# Patient Record
Sex: Female | Born: 1990 | Race: Black or African American | Hispanic: No | Marital: Single | Smoking: Current every day smoker
Health system: Southern US, Community
[De-identification: ages and names within clinical notes are randomized; demographics above are authoritative.]

## PROBLEM LIST (undated history)

## (undated) DIAGNOSIS — O1490 Unspecified pre-eclampsia, unspecified trimester: Secondary | ICD-10-CM

## (undated) DIAGNOSIS — R569 Unspecified convulsions: Secondary | ICD-10-CM

---

## 2019-01-23 ENCOUNTER — Inpatient Hospital Stay: Payer: Medicaid Other | Admitting: Anesthesiology

## 2019-01-23 ENCOUNTER — Inpatient Hospital Stay
Admission: EM | Admit: 2019-01-23 | Discharge: 2019-01-25 | DRG: 787 | Disposition: A | Discharge status: Home / Self Care | Payer: Medicaid Other | Attending: Obstetrics and Gynecology | Admitting: Obstetrics and Gynecology

## 2019-01-23 ENCOUNTER — Other Ambulatory Visit: Payer: Self-pay

## 2019-01-23 ENCOUNTER — Encounter
Admission: EM | Disposition: A | Discharge status: Home / Self Care | Payer: Self-pay | Source: Home / Self Care | Attending: Obstetrics and Gynecology

## 2019-01-23 DIAGNOSIS — Z3A38 38 weeks gestation of pregnancy: Secondary | ICD-10-CM | POA: Diagnosis not present

## 2019-01-23 DIAGNOSIS — O99324 Drug use complicating childbirth: Secondary | ICD-10-CM | POA: Diagnosis present

## 2019-01-23 DIAGNOSIS — D62 Acute posthemorrhagic anemia: Secondary | ICD-10-CM | POA: Diagnosis not present

## 2019-01-23 DIAGNOSIS — Z1159 Encounter for screening for other viral diseases: Secondary | ICD-10-CM

## 2019-01-23 DIAGNOSIS — O9081 Anemia of the puerperium: Secondary | ICD-10-CM | POA: Diagnosis not present

## 2019-01-23 DIAGNOSIS — R633 Feeding difficulties: Secondary | ICD-10-CM

## 2019-01-23 DIAGNOSIS — F1290 Cannabis use, unspecified, uncomplicated: Secondary | ICD-10-CM | POA: Diagnosis present

## 2019-01-23 DIAGNOSIS — R6339 Other feeding difficulties: Secondary | ICD-10-CM

## 2019-01-23 DIAGNOSIS — O34211 Maternal care for low transverse scar from previous cesarean delivery: Secondary | ICD-10-CM | POA: Diagnosis present

## 2019-01-23 LAB — CBC
HCT: 35.4 % — ABNORMAL LOW (ref 36.0–46.0)
Hemoglobin: 10.7 g/dL — ABNORMAL LOW (ref 12.0–15.0)
MCH: 23.8 pg — ABNORMAL LOW (ref 26.0–34.0)
MCHC: 30.2 g/dL (ref 30.0–36.0)
MCV: 78.7 fL — ABNORMAL LOW (ref 80.0–100.0)
Platelets: 236 10*3/uL (ref 150–400)
RBC: 4.5 MIL/uL (ref 3.87–5.11)
RDW: 22.5 % — ABNORMAL HIGH (ref 11.5–15.5)
WBC: 14.6 10*3/uL — ABNORMAL HIGH (ref 4.0–10.5)
nRBC: 0 % (ref 0.0–0.2)

## 2019-01-23 LAB — WET PREP, GENITAL
Clue Cells Wet Prep HPF POC: NONE SEEN
Sperm: NONE SEEN
Trich, Wet Prep: NONE SEEN
Yeast Wet Prep HPF POC: NONE SEEN

## 2019-01-23 LAB — BASIC METABOLIC PANEL
Anion gap: 9 (ref 5–15)
BUN: 7 mg/dL (ref 6–20)
CO2: 23 mmol/L (ref 22–32)
Calcium: 8.7 mg/dL — ABNORMAL LOW (ref 8.9–10.3)
Chloride: 104 mmol/L (ref 98–111)
Creatinine, Ser: 0.57 mg/dL (ref 0.44–1.00)
GFR calc Af Amer: >60 mL/min (ref >60)
GFR calc non Af Amer: >60 mL/min (ref >60)
Glucose, Bld: 79 mg/dL (ref 70–99)
Potassium: 3.6 mmol/L (ref 3.5–5.1)
Sodium: 136 mmol/L (ref 135–145)

## 2019-01-23 LAB — RAPID HIV SCREEN (HIV 1/2 AB+AG)
HIV 1/2 Antibodies: NONREACTIVE
HIV-1 P24 Antigen - HIV24: NONREACTIVE

## 2019-01-23 LAB — GLUCOSE, CAPILLARY
Glucose-Capillary: 58 mg/dL — ABNORMAL LOW (ref 70–99)
Glucose-Capillary: 60 mg/dL — ABNORMAL LOW (ref 70–99)
Glucose-Capillary: 124 mg/dL — ABNORMAL HIGH (ref 70–99)

## 2019-01-23 SURGERY — Surgical Case
Anesthesia: Spinal

## 2019-01-23 MED ORDER — FENTANYL CITRATE (PF) 100 MCG/2ML IJ SOLN
INTRAMUSCULAR | Status: DC | PRN
  Administered 2019-01-23: 15 ug via INTRATHECAL

## 2019-01-23 MED ORDER — CEFAZOLIN SODIUM-DEXTROSE 2-4 GM/100ML-% IV SOLN
2.0000 g | INTRAVENOUS | Status: AC
  Administered 2019-01-23: 2 g via INTRAVENOUS
  Filled 2019-01-23: qty 100

## 2019-01-23 MED ORDER — DIPHENHYDRAMINE HCL 25 MG PO CAPS: 25.0000 mg | ORAL_CAPSULE | ORAL | Status: DC | PRN

## 2019-01-23 MED ORDER — OXYCODONE HCL 5 MG PO TABS
5.0000 mg | ORAL_TABLET | ORAL | Status: DC | PRN
  Administered 2019-01-23 – 2019-01-25 (×7): 10 mg via ORAL
  Filled 2019-01-23 (×7): qty 2

## 2019-01-23 MED ORDER — BUPIVACAINE IN DEXTROSE 0.75-8.25 % IT SOLN
INTRATHECAL | Status: DC | PRN
  Administered 2019-01-23: 1.6 mL via INTRATHECAL

## 2019-01-23 MED ORDER — NALBUPHINE HCL 10 MG/ML IJ SOLN: 5.0000 mg | Freq: Once | INTRAMUSCULAR | Status: DC | PRN

## 2019-01-23 MED ORDER — SODIUM CHLORIDE 0.9 % IV SOLN
500.0000 mg | INTRAVENOUS | Status: DC
  Administered 2019-01-23: 500 mg via INTRAVENOUS
  Filled 2019-01-23: qty 500

## 2019-01-23 MED ORDER — NALBUPHINE HCL 10 MG/ML IJ SOLN: 5.0000 mg | INTRAMUSCULAR | Status: DC | PRN

## 2019-01-23 MED ORDER — FLEET ENEMA 7-19 GM/118ML RE ENEM: 1.0000 | ENEMA | Freq: Every day | RECTAL | Status: DC | PRN

## 2019-01-23 MED ORDER — MEASLES, MUMPS & RUBELLA VAC IJ SOLR
0.5000 mL | Freq: Once | INTRAMUSCULAR | Status: DC
  Filled 2019-01-23 (×2): qty 0.5

## 2019-01-23 MED ORDER — MORPHINE SULFATE (PF) 0.5 MG/ML IJ SOLN
INTRAMUSCULAR | Status: DC | PRN
  Administered 2019-01-23: .1 mg via INTRATHECAL

## 2019-01-23 MED ORDER — SOD CITRATE-CITRIC ACID 500-334 MG/5ML PO SOLN
30.0000 mL | ORAL | Status: AC
  Administered 2019-01-23: 09:00:00 30 mL via ORAL

## 2019-01-23 MED ORDER — BUPIVACAINE LIPOSOME 1.3 % IJ SUSP
INTRAMUSCULAR | Status: AC
  Filled 2019-01-23: qty 20

## 2019-01-23 MED ORDER — NALOXONE HCL 0.4 MG/ML IJ SOLN: 0.4000 mg | INTRAMUSCULAR | Status: DC | PRN

## 2019-01-23 MED ORDER — SODIUM CHLORIDE (PF) 0.9 % IJ SOLN
INTRAMUSCULAR | Status: AC
  Filled 2019-01-23: qty 50

## 2019-01-23 MED ORDER — SIMETHICONE 80 MG PO CHEW
80.0000 mg | CHEWABLE_TABLET | ORAL | Status: DC | PRN
  Filled 2019-01-23: qty 1

## 2019-01-23 MED ORDER — PRENATAL MULTIVITAMIN CH
1.0000 | ORAL_TABLET | Freq: Every day | ORAL | Status: DC
  Administered 2019-01-23 – 2019-01-25 (×3): 1 via ORAL
  Filled 2019-01-23 (×3): qty 1

## 2019-01-23 MED ORDER — KETOROLAC TROMETHAMINE 30 MG/ML IJ SOLN
30.0000 mg | Freq: Four times a day (QID) | INTRAMUSCULAR | Status: AC
  Administered 2019-01-23: 30 mg via INTRAVENOUS
  Filled 2019-01-23 (×2): qty 1

## 2019-01-23 MED ORDER — SENNOSIDES-DOCUSATE SODIUM 8.6-50 MG PO TABS
2.0000 | ORAL_TABLET | ORAL | Status: DC
  Administered 2019-01-23 – 2019-01-24 (×2): 2 via ORAL
  Filled 2019-01-23 (×2): qty 2

## 2019-01-23 MED ORDER — DEXTROSE IN LACTATED RINGERS 5 % IV SOLN
INTRAVENOUS | Status: DC
  Administered 2019-01-23: 12:00:00 via INTRAVENOUS

## 2019-01-23 MED ORDER — FENTANYL CITRATE (PF) 100 MCG/2ML IJ SOLN
INTRAMUSCULAR | Status: AC
  Filled 2019-01-23: qty 2

## 2019-01-23 MED ORDER — IBUPROFEN 800 MG PO TABS
800.0000 mg | ORAL_TABLET | Freq: Four times a day (QID) | ORAL | Status: DC
  Administered 2019-01-24 – 2019-01-25 (×6): 800 mg via ORAL
  Filled 2019-01-23 (×6): qty 1

## 2019-01-23 MED ORDER — KETOROLAC TROMETHAMINE 30 MG/ML IJ SOLN: 30.0000 mg | Freq: Four times a day (QID) | INTRAMUSCULAR | Status: DC

## 2019-01-23 MED ORDER — ONDANSETRON HCL 4 MG/2ML IJ SOLN
INTRAMUSCULAR | Status: AC
  Filled 2019-01-23: qty 2

## 2019-01-23 MED ORDER — BUPIVACAINE HCL (PF) 0.5 % IJ SOLN
INTRAMUSCULAR | Status: AC
  Filled 2019-01-23: qty 30

## 2019-01-23 MED ORDER — SODIUM CHLORIDE 0.9 % IV SOLN
INTRAVENOUS | Status: DC | PRN
  Administered 2019-01-23: 70 mL

## 2019-01-23 MED ORDER — ONDANSETRON HCL 4 MG/2ML IJ SOLN: 4.0000 mg | Freq: Three times a day (TID) | INTRAMUSCULAR | Status: DC | PRN

## 2019-01-23 MED ORDER — OXYTOCIN 40 UNITS IN NORMAL SALINE INFUSION - SIMPLE MED
INTRAVENOUS | Status: DC | PRN
  Administered 2019-01-23: 500 mL via INTRAVENOUS

## 2019-01-23 MED ORDER — ACETAMINOPHEN 500 MG PO TABS
1000.0000 mg | ORAL_TABLET | Freq: Four times a day (QID) | ORAL | Status: DC
  Administered 2019-01-23 – 2019-01-25 (×8): 1000 mg via ORAL
  Filled 2019-01-23 (×8): qty 2

## 2019-01-23 MED ORDER — ACETAMINOPHEN 325 MG PO TABS
650.0000 mg | ORAL_TABLET | Freq: Four times a day (QID) | ORAL | Status: DC
  Administered 2019-01-23: 650 mg via ORAL
  Filled 2019-01-23: qty 2

## 2019-01-23 MED ORDER — ONDANSETRON HCL 4 MG/2ML IJ SOLN
INTRAMUSCULAR | Status: DC | PRN
  Administered 2019-01-23 (×2): 4 mg via INTRAVENOUS

## 2019-01-23 MED ORDER — LACTATED RINGERS IV SOLN
INTRAVENOUS | Status: DC
  Administered 2019-01-23: 09:00:00 via INTRAVENOUS

## 2019-01-23 MED ORDER — KETOROLAC TROMETHAMINE 30 MG/ML IJ SOLN
30.0000 mg | Freq: Four times a day (QID) | INTRAMUSCULAR | Status: DC
  Administered 2019-01-23: 30 mg via INTRAVENOUS
  Filled 2019-01-23: qty 1

## 2019-01-23 MED ORDER — MENTHOL 3 MG MT LOZG
1.0000 | LOZENGE | OROMUCOSAL | Status: DC | PRN
  Filled 2019-01-23: qty 9

## 2019-01-23 MED ORDER — DIPHENHYDRAMINE HCL 50 MG/ML IJ SOLN: 12.5000 mg | INTRAMUSCULAR | Status: DC | PRN

## 2019-01-23 MED ORDER — COCONUT OIL OIL
1.0000 "application " | TOPICAL_OIL | Status: DC | PRN
  Filled 2019-01-23: qty 120

## 2019-01-23 MED ORDER — MORPHINE SULFATE (PF) 0.5 MG/ML IJ SOLN
INTRAMUSCULAR | Status: AC
  Filled 2019-01-23: qty 10

## 2019-01-23 MED ORDER — OXYCODONE HCL 5 MG PO TABS: 5.0000 mg | ORAL_TABLET | ORAL | Status: DC | PRN

## 2019-01-23 MED ORDER — PHENYLEPHRINE HCL (PRESSORS) 10 MG/ML IV SOLN
INTRAVENOUS | Status: DC | PRN
  Administered 2019-01-23 (×5): 100 ug via INTRAVENOUS

## 2019-01-23 MED ORDER — PHENYLEPHRINE HCL (PRESSORS) 10 MG/ML IV SOLN
INTRAVENOUS | Status: AC
  Filled 2019-01-23: qty 1

## 2019-01-23 MED ORDER — SOD CITRATE-CITRIC ACID 500-334 MG/5ML PO SOLN
ORAL | Status: AC
  Filled 2019-01-23: qty 15

## 2019-01-23 MED ORDER — OXYTOCIN 40 UNITS IN NORMAL SALINE INFUSION - SIMPLE MED
INTRAVENOUS | Status: AC
  Filled 2019-01-23: qty 1000

## 2019-01-23 MED ORDER — DIPHENHYDRAMINE HCL 25 MG PO CAPS
25.0000 mg | ORAL_CAPSULE | Freq: Four times a day (QID) | ORAL | Status: DC | PRN
  Administered 2019-01-23: 25 mg via ORAL
  Filled 2019-01-23: qty 1

## 2019-01-23 MED ORDER — GABAPENTIN 300 MG PO CAPS
300.0000 mg | ORAL_CAPSULE | Freq: Every day | ORAL | Status: DC
  Administered 2019-01-23 – 2019-01-24 (×2): 300 mg via ORAL
  Filled 2019-01-23 (×2): qty 1

## 2019-01-23 MED ORDER — SODIUM CHLORIDE 0.9 % IV SOLN
INTRAVENOUS | Status: DC | PRN
  Administered 2019-01-23: 09:00:00 30 ug/min via INTRAVENOUS

## 2019-01-23 MED ORDER — LACTATED RINGERS IV SOLN
INTRAVENOUS | Status: DC | PRN
  Administered 2019-01-23: 09:00:00 via INTRAVENOUS

## 2019-01-23 MED ORDER — BUPIVACAINE HCL (PF) 0.5 % IJ SOLN
INTRAMUSCULAR | Status: DC | PRN
  Administered 2019-01-23: 30 mL

## 2019-01-23 MED ORDER — LACTATED RINGERS IV SOLN: INTRAVENOUS | Status: DC

## 2019-01-23 MED ORDER — WITCH HAZEL-GLYCERIN EX PADS: 1.0000 "application " | MEDICATED_PAD | CUTANEOUS | Status: DC | PRN

## 2019-01-23 MED ORDER — DIBUCAINE (PERIANAL) 1 % EX OINT: 1.0000 "application " | TOPICAL_OINTMENT | CUTANEOUS | Status: DC | PRN

## 2019-01-23 MED ORDER — SIMETHICONE 80 MG PO CHEW
80.0000 mg | CHEWABLE_TABLET | Freq: Three times a day (TID) | ORAL | Status: DC
  Administered 2019-01-23 – 2019-01-25 (×7): 80 mg via ORAL
  Filled 2019-01-23 (×6): qty 1

## 2019-01-23 MED ORDER — MEPERIDINE HCL 25 MG/ML IJ SOLN: 6.2500 mg | INTRAMUSCULAR | Status: DC | PRN

## 2019-01-23 MED ORDER — BISACODYL 10 MG RE SUPP: 10.0000 mg | Freq: Every day | RECTAL | Status: DC | PRN

## 2019-01-23 MED ORDER — FERROUS SULFATE 325 (65 FE) MG PO TABS
325.0000 mg | ORAL_TABLET | Freq: Two times a day (BID) | ORAL | Status: DC
  Administered 2019-01-23 – 2019-01-25 (×5): 325 mg via ORAL
  Filled 2019-01-23 (×5): qty 1

## 2019-01-23 MED ORDER — SIMETHICONE 80 MG PO CHEW
80.0000 mg | CHEWABLE_TABLET | ORAL | Status: DC
  Administered 2019-01-23 – 2019-01-24 (×2): 80 mg via ORAL
  Filled 2019-01-23 (×2): qty 1

## 2019-01-23 MED ORDER — OXYCODONE HCL 5 MG PO TABS: 10.0000 mg | ORAL_TABLET | ORAL | Status: DC | PRN

## 2019-01-23 MED ORDER — TETANUS-DIPHTH-ACELL PERTUSSIS 5-2.5-18.5 LF-MCG/0.5 IM SUSP: 0.5000 mL | Freq: Once | INTRAMUSCULAR | Status: DC

## 2019-01-23 MED ORDER — SODIUM CHLORIDE 0.9% FLUSH: 3.0000 mL | INTRAVENOUS | Status: DC | PRN

## 2019-01-23 MED ORDER — OXYTOCIN 40 UNITS IN NORMAL SALINE INFUSION - SIMPLE MED
2.5000 [IU]/h | INTRAVENOUS | Status: AC
  Administered 2019-01-23: 2.5 [IU]/h via INTRAVENOUS
  Filled 2019-01-23: qty 1000

## 2019-01-23 SURGICAL SUPPLY — 31 items
BARRIER ADHS 3X4 INTERCEED (GAUZE/BANDAGES/DRESSINGS) ×2 IMPLANT
CANISTER SUCT 3000ML PPV (MISCELLANEOUS) ×3 IMPLANT
CHLORAPREP W/TINT 26 (MISCELLANEOUS) ×3 IMPLANT
COVER WAND RF STERILE (DRAPES) ×3 IMPLANT
DRSG TELFA 3X8 NADH (GAUZE/BANDAGES/DRESSINGS) IMPLANT
ELECT REM PT RETURN 9FT ADLT (ELECTROSURGICAL) ×3
ELECTRODE REM PT RTRN 9FT ADLT (ELECTROSURGICAL) ×1 IMPLANT
GAUZE SPONGE 4X4 12PLY STRL (GAUZE/BANDAGES/DRESSINGS) ×5 IMPLANT
GOWN STRL REUS W/ TWL LRG LVL3 (GOWN DISPOSABLE) ×3 IMPLANT
GOWN STRL REUS W/TWL LRG LVL3 (GOWN DISPOSABLE) ×6
NDL HYPO 25GX1X1/2 BEV (NEEDLE) ×1 IMPLANT
NEEDLE HYPO 22GX1.5 SAFETY (NEEDLE) ×2 IMPLANT
NEEDLE HYPO 25GX1X1/2 BEV (NEEDLE) ×3 IMPLANT
NS IRRIG 1000ML POUR BTL (IV SOLUTION) ×3 IMPLANT
PACK C SECTION AR (MISCELLANEOUS) ×3 IMPLANT
PAD ABD DERMACEA PRESS 5X9 (GAUZE/BANDAGES/DRESSINGS) ×2 IMPLANT
PAD DRESSING TELFA 3X8 NADH (GAUZE/BANDAGES/DRESSINGS) ×1 IMPLANT
PAD OB MATERNITY 4.3X12.25 (PERSONAL CARE ITEMS) ×3 IMPLANT
PAD PREP 24X41 OB/GYN DISP (PERSONAL CARE ITEMS) ×3 IMPLANT
PENCIL SMOKE ULTRAEVAC 22 CON (MISCELLANEOUS) ×3 IMPLANT
SPONGE LAP 18X18 RF (DISPOSABLE) ×2 IMPLANT
SUT MNCRL 4-0 (SUTURE)
SUT MNCRL 4-0 27XMFL (SUTURE)
SUT VIC AB 0 CT1 36 (SUTURE) ×6 IMPLANT
SUT VIC AB 0 CTX 36 (SUTURE) ×4
SUT VIC AB 0 CTX36XBRD ANBCTRL (SUTURE) ×2 IMPLANT
SUT VIC AB 2-0 CT1 36 (SUTURE) ×2 IMPLANT
SUT VIC AB 2-0 SH 27 (SUTURE) ×4
SUT VIC AB 2-0 SH 27XBRD (SUTURE) ×2 IMPLANT
SUTURE MNCRL 4-0 27XMF (SUTURE) ×1 IMPLANT
SYR 30ML LL (SYRINGE) ×6 IMPLANT

## 2019-01-23 NOTE — Progress Notes (Signed)
Pt called out to RN stating that she had a question about the baby. RN sat pt up to attempt to breastfeed and the patient said her vision went blurry and was lethargic. Assessed pt BP and it was WNL. Sat the pt back and she said that it helped and checked a CBG at 1102 and it was 60. Gave the patient apple juice and notified Leafy Ro, MD and [place], MDA. Rechecked CBG at 1121 and CBG was 58. Leafy Ro, MD notified and order given for pt to receive 250 bolus of LR D5 and give another cup of apple juice. Infusion started at 1135 and 2 cups of apple juice given to pt.

## 2019-01-23 NOTE — Anesthesia Post-op Follow-up Note (Signed)
Anesthesia QCDR form completed.        

## 2019-01-23 NOTE — Lactation Note (Signed)
This note was copied from a baby's chart. Lactation Consultation Note  Patient Name: Judith Reed BXUXY'B Date: 01/23/2019 Reason for consult: Follow-up assessment;Early term 72-38.6wks  Assisted mom in comfortable position with pillow support.  Mom can easily hand express colostrum to entice Kacey to latch.  Mom latched [place] with minimal assistance and he began strong, rhythmic sucking with wide open mouth and flanged lips.  Mom reports first baby would not latch so pumped and breast fed other 3 for 3 months until milk started to dry up.  Mom's last baby is only 33 months old.  Mom reports slight nipple tenderness and asking for some nipple cream.  Coconut oil given and instructed in use.  Lactation name and number written on white board and encouraged to call with any questions, concerns or assistance.   Maternal Data Formula Feeding for Exclusion: No Has patient been taught Hand Expression?: Yes Does the patient have breastfeeding experience prior to this delivery?: Yes  Feeding Feeding Type: Breast Fed  LATCH Score Latch: Repeated attempts needed to sustain latch, nipple held in mouth throughout feeding, stimulation needed to elicit sucking reflex.  Audible Swallowing: A few with stimulation  Type of Nipple: Everted at rest and after stimulation  Comfort (Breast/Nipple): Filling, red/small blisters or bruises, mild/mod discomfort  Hold (Positioning): No assistance needed to correctly position infant at breast.  LATCH Score: 7  Interventions Interventions: Breast feeding basics reviewed;Breast massage;Breast compression;Adjust position;Support pillows;Position options;Coconut oil  Lactation Tools Discussed/Used Tools: Coconut oil WIC Program: Yes   Consult Status Consult Status: PRN Follow-up type: Call as needed    Jarold Motto 01/23/2019, 9:48 PM

## 2019-01-23 NOTE — Op Note (Signed)
  Cesarean Section Procedure Note  Date of procedure: 01/23/2019   Pre-operative Diagnosis: Intrauterine pregnancy at [redacted]w[redacted]d;  - prior c/s x4 - active labor - short interval pregnancy - incomplete prenatal care  Post-operative Diagnosis: same, delivered.  Procedure: Repeat Low Transverse Cesarean Section through Pfannenstiel incision  Surgeon: Benjaman Kindler, MD  Assistant(s):  CST, Dr. Jeannie Fend  Anesthesia: Spinal anesthesia  Anesthesiologist: Emmie Niemann, MD Anesthesiologist: Emmie Niemann, MD CRNA: Doreen Salvage, CRNA; Allean Found, CRNA  Estimated Blood Loss:  425 ml         Total IV Fluids: 1000 ml  Urine Output: 50 ml         Specimens: none         Complications:  None; patient tolerated the procedure well.         Disposition: PACU - hemodynamically stable.         Condition: stable  Findings:  A female infant "Kasey" in cephalic presentation. Amniotic fluid - Clear  Birth weight 6#14oz.  Apgars of 8 and 9 at one and five minutes respectively.  Intact placenta with a three-vessel cord.  Grossly normal uterus, tubes and ovaries bilaterally. Minimal intraabdominal adhesions were noted, with thick supra fascial adhesions. No bowel or bladder adhesions, though peritoneum was adherent to the anterior abdominal wall.  Indications: active labor with prior cesarean section x4   Procedure Details  The patient was taken to Operating Room, identified as the correct patient and the procedure verified as C-Section Delivery. A formal Time Out was held with all team members present and in agreement.  After induction of anesthesia, the patient was draped and prepped in the usual sterile manner. A Pfannenstiel skin incision was made and carried down through the subcutaneous tissue to the fascia with a knife. Fascial incision was made directly into the peritoneal cavity with the knife and extended transversely with the knife. The fascia was separated from the underlying  rectus tissue superiorly and inferiorly. Peritoneal incision was extended longitudinally. The utero-vesical peritoneal reflection was incised transversely and a bladder flap was created digitally, amazingly.  A low transverse hysterotomy was made. The fetus was delivered atraumatically. The umbilical cord was clamped x2 and cut and the infant was handed to the awaiting pediatricians. The placenta was removed intact and appeared normal, intact, and with a 3-vessel cord.   The uterus was exteriorized and cleared of all clot and debris. The hysterotomy was closed with running sutures of 0-Vicryl. A second imbricating layer was placed with the same suture for hemostasis. Excellent hemostasis was observed. The peritoneal cavity was cleared of all clots and debris. The uterus was returned to the abdomen.   The fascia was then reapproximated with running sutures of 0 Vicryl. The subcutaneous tissue was reapproximated with running sutures of 0 Vicry. The skin was reapproximated with a 4-0 Monocryl subcuticular stitch. 38ml (in 30 of 0.5% bupivicaine and 18ml of NSS) of liposomal bupivicaine placed in the fascial and skin lines.  Instrument, sponge, and needle counts were correct prior to the abdominal closure and at the conclusion of the case.   The patient tolerated the procedure well and was transferred to the recovery room in stable condition.   Benjaman Kindler, MD 01/23/2019

## 2019-01-23 NOTE — Anesthesia Preprocedure Evaluation (Signed)
Anesthesia Evaluation  Patient identified by MRN, date of birth, ID band Patient awake    Reviewed: Allergy & Precautions, NPO status , Patient's Chart, lab work & pertinent test results  History of Anesthesia Complications Negative for: history of anesthetic complications  Airway Mallampati: II  TM Distance: >3 FB Neck ROM: Full    Dental  (+) Missing, Poor Dentition   Pulmonary neg sleep apnea, neg COPD, former smoker,    breath sounds clear to auscultation- rhonchi (-) wheezing      Cardiovascular hypertension,  Rhythm:Regular Rate:Normal - Systolic murmurs and - Diastolic murmurs    Neuro/Psych neg Seizures negative neurological ROS  negative psych ROS   GI/Hepatic negative GI ROS, Neg liver ROS,   Endo/Other  negative endocrine ROSneg diabetes  Renal/GU negative Renal ROS     Musculoskeletal negative musculoskeletal ROS (+)   Abdominal (+) - obese,   Peds  Hematology negative hematology ROS (+)   Anesthesia Other Findings cHTN, not currently on medication 4 prior cesarean deliveries   Reproductive/Obstetrics (+) Pregnancy                             Lab Results  Component Value Date   WBC 14.6 (H) 01/23/2019   HGB 10.7 (L) 01/23/2019   HCT 35.4 (L) 01/23/2019   MCV 78.7 (L) 01/23/2019   PLT 236 01/23/2019    Anesthesia Physical Anesthesia Plan  ASA: II  Anesthesia Plan: Spinal   Post-op Pain Management:    Induction:   PONV Risk Score and Plan: 2 and Ondansetron  Airway Management Planned: Natural Airway  Additional Equipment:   Intra-op Plan:   Post-operative Plan:   Informed Consent: I have reviewed the patients History and Physical, chart, labs and discussed the procedure including the risks, benefits and alternatives for the proposed anesthesia with the patient or authorized representative who has indicated his/her understanding and acceptance.      Dental advisory given  Plan Discussed with: CRNA and Anesthesiologist  Anesthesia Plan Comments:         Anesthesia Quick Evaluation

## 2019-01-23 NOTE — Anesthesia Procedure Notes (Signed)
Spinal  Patient location during procedure: OR Start time: 01/23/2019 9:05 AM End time: 01/23/2019 9:10 AM Staffing Anesthesiologist: Emmie Niemann, MD Performed: anesthesiologist  Preanesthetic Checklist Completed: patient identified, site marked, surgical consent, pre-op evaluation, timeout performed, IV checked, risks and benefits discussed and monitors and equipment checked Spinal Block Patient position: sitting Prep: ChloraPrep Patient monitoring: heart rate, continuous pulse ox and blood pressure Approach: midline Location: L3-4 Injection technique: single-shot Needle Needle type: Pencil-Tip and Introducer  Needle gauge: 24 G Needle length: 9 cm Assessment Sensory level: T4 Additional Notes Negative paresthesia. Negative blood return. Positive free-flowing CSF. Expiration date of kit checked and confirmed. Patient tolerated procedure well, without complications.
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
## 2019-01-23 NOTE — Lactation Note (Signed)
This note was copied from a baby's chart. Lactation Consultation Note  Patient Name: Judith Reed AJOIN'O Date: 01/23/2019   [facility] assisted mom with breastfeeding. Mom was breastfeeding experience with her other children. Infant was asleep swaddled, removed blanket and assisted with pacing infant skin to skin with mom.  Baby began rooting, displaying cues; mom was able to place baby in cradle position. LC assisted with holding breast tissue nose to nipple, baby gave wide open mouth and latched with minimal assistance.  Reviewed with mom early hunger cues, benefits of skin to skin, typical feeding patterns of newborns, newborn stomach size.  Encouraged to put baby to skin to skin at the end of the feeding, and reviewed benefits of skin to skin. LC told mom to call when transferred to [facility], or with any questions/concerns.  Baby remained at breast when [facility] left the room at 12pm, breastfeeding for at least 15 minutes at that time.  Maternal Data    Feeding Feeding Type: Breast Fed  LATCH Score Latch: Repeated attempts needed to sustain latch, nipple held in mouth throughout feeding, stimulation needed to elicit sucking reflex.  Audible Swallowing: Spontaneous and intermittent  Type of Nipple: Everted at rest and after stimulation  Comfort (Breast/Nipple): Soft / non-tender  Hold (Positioning): Assistance needed to correctly position infant at breast and maintain latch.  LATCH Score: 8  Interventions    Lactation Tools Discussed/Used     Consult Status Consult Status: Follow-up Date: 01/23/19 Follow-up type: In-patient    Lavonia Drafts 01/23/2019, 12:38 PM

## 2019-01-23 NOTE — Anesthesia Procedure Notes (Signed)
Date/Time: 01/23/2019 9:04 AM Performed by: Allean Found, CRNA Pre-anesthesia Checklist: Patient identified, Emergency Drugs available, Suction available, Patient being monitored and Timeout performed Patient Re-evaluated:Patient Re-evaluated prior to induction Oxygen Delivery Method: Nasal cannula Placement Confirmation: positive ETCO2

## 2019-01-23 NOTE — Plan of Care (Signed)
Pt [id number] presents to L&D for labor. Pt was contracting every 2-4 minutes and is a previous c/s x4. MD called and at bedside and spoke to pt about plan of care- performing c/s for delivery of baby. Pt consented and agreed to plan of care and delivered a baby boy via c/s. Pt brought to PACU for recovery and taken to M/B for couplet care until d/c.

## 2019-01-23 NOTE — Transfer of Care (Signed)
Immediate Anesthesia Transfer of Care Note  Patient: Judith Reed  Procedure(s) Performed: CESAREAN SECTION (N/A )  Patient Location: PACU  Anesthesia Type:Spinal  Level of Consciousness: awake, alert  and oriented  Airway & Oxygen Therapy: Patient Spontanous Breathing  Post-op Assessment: Report given to RN and Post -op Vital signs reviewed and stable  Post vital signs: Reviewed and stable  Last Vitals: 118/67 72 18 Vitals Value Taken Time  BP    Temp    Pulse    Resp    SpO2      Last Pain:  Vitals:   01/23/19 0809  TempSrc:   PainSc: 9       Patients Stated Pain Goal: 0 (14/97/02 6378)  Complications: No apparent anesthesia complications

## 2019-01-24 LAB — RPR, QUANT+TP ABS (REFLEX)
Rapid Plasma Reagin, Quant: 1:2 {titer} — ABNORMAL HIGH
T Pallidum Abs: NONREACTIVE

## 2019-01-24 LAB — CBC
HCT: 27.2 % — ABNORMAL LOW (ref 36.0–46.0)
Hemoglobin: 8.3 g/dL — ABNORMAL LOW (ref 12.0–15.0)
MCH: 24.1 pg — ABNORMAL LOW (ref 26.0–34.0)
MCHC: 30.5 g/dL (ref 30.0–36.0)
MCV: 79.1 fL — ABNORMAL LOW (ref 80.0–100.0)
Platelets: 183 10*3/uL (ref 150–400)
RBC: 3.44 MIL/uL — ABNORMAL LOW (ref 3.87–5.11)
RDW: 22 % — ABNORMAL HIGH (ref 11.5–15.5)
WBC: 14.8 10*3/uL — ABNORMAL HIGH (ref 4.0–10.5)
nRBC: 0 % (ref 0.0–0.2)

## 2019-01-24 LAB — T.PALLIDUM AB, TOTAL: T Pallidum Abs: NONREACTIVE

## 2019-01-24 LAB — RPR: RPR Ser Ql: REACTIVE — AB

## 2019-01-24 NOTE — Progress Notes (Signed)
Post Partum Day 1 Subjective: Doing well, no complaints.  Tolerating regular diet, pain with PO meds, voiding and ambulating without difficulty.  No CP SOB Fever,Chills, N/V or leg pain; denies nipple or breast pain, no HA change of vision, RUQ/epigastric pain  Objective: BP 138/77 (BP Location: Left Arm)   Pulse 70   Temp 98.5 F (36.9 C) (Oral)   Resp 16   Ht 5\' 1"  (1.549 m)   Wt 63.5 kg   SpO2 100%   Breastfeeding Unknown   BMI 26.45 kg/m    Physical Exam:  General: NAD Breasts: soft/nontender CV: RRR Pulm: nl effort, CTABL Abdomen: soft, NT, BS x 4 Incision: Pressure dsg CDI Lochia: moderate Uterine Fundus: fundus firm and 3 fb below umbilicus DVT Evaluation: no cords, ttp LEs   Recent Labs    01/23/19 0854 01/24/19 0504  HGB 10.7* 8.3*  HCT 35.4* 27.2*  WBC 14.6* 14.8*  PLT 236 183    Assessment/Plan: 28 y.o. L4T6256 postpartum day # 1  - Continue routine PP care - Lactation consult prn  - Discussed contraceptive options including implant, IUDs hormonal and non-hormonal, injection, pills/ring/patch, condoms, and NFP. Desires Nexplanon implant.  - Acute blood loss anemia - hemodynamically stable and asymptomatic; start po ferrous sulfate BID with stool softeners  - Immunization status: offer Tdap prior to dC    Disposition: Does not desire Dc home today.     Francetta Found, CNM 01/24/2019  6:33 PM

## 2019-01-24 NOTE — Anesthesia Postprocedure Evaluation (Signed)
Anesthesia Post Note  Patient: Judith Reed  Procedure(s) Performed: CESAREAN SECTION (N/A )  Patient location during evaluation: Mother Baby Anesthesia Type: Spinal Level of consciousness: oriented and awake and alert Pain management: pain level controlled Vital Signs Assessment: post-procedure vital signs reviewed and stable Respiratory status: spontaneous breathing and respiratory function stable Cardiovascular status: blood pressure returned to baseline and stable Postop Assessment: no headache, no backache, no apparent nausea or vomiting and able to ambulate Anesthetic complications: no     Last Vitals:  Vitals:   01/24/19 0500 01/24/19 0726  BP:  123/65  Pulse:  73  Resp:  18  Temp:  36.6 C  SpO2: 99% 100%    Last Pain:  Vitals:   01/24/19 0726  TempSrc: Oral  PainSc:                  Hedda Slade

## 2019-01-24 NOTE — Clinical Social Work Maternal (Signed)
CLINICAL SOCIAL WORK MATERNAL/CHILD NOTE  Patient Details  Name: Judith Reed MRN: [id number] Date of Birth: [birth year]  Date:  01/24/2019  Clinical Social Worker Initiating Note:  Mel Almond Deem Marmol, LCSW Date/Time: Initiated:  01/24/19/1623     Child's Name:  Judith Reed   Biological Parents:  Mother   Need for Interpreter:  None   Reason for Referral:  Other (Comment)(Depression screen scored 10)   Address:  [address] [place] [address]    Phone number:  [phone] (home)     Additional phone number:  Household Members/Support Persons (HM/SP):   Household Member/Support Person 1   HM/SP Name Relationship DOB or Age  HM/SP -1 Mother's boyfriend Mother's boyfriend    HM/SP -2        HM/SP -3        HM/SP -4        HM/SP -5        HM/SP -6        HM/SP -7        HM/SP -8          Natural Supports (not living in the home):  Extended Family, Immediate Family   Professional Supports:     Employment: Agricultural engineer   Type of Work:     Education:  Programmer, systems   Homebound arranged:    Museum/gallery curator Resources:  Medicaid   Other Resources:      Cultural/Religious Considerations Which May Impact Care:    Strengths:  Ability to meet basic needs    Psychotropic Medications:         Pediatrician:       Pediatrician List:   [place]      Pediatrician Fax Number:    Risk Factors/Current Problems:  Substance Use    Cognitive State:  Alert    Mood/Affect:  Bright , Happy    CSW Assessment: Clinical Education officer, museum (CSW) received consult due to mother scoring 10 on depression screen. CSW met with mother and infant Judith Reed was at bedside. Mother was alert and engaged during assessment. CSW introduced self and explained role of CSW department. Per mother she has been stressed because she recently moved to [place]. Mother reported that her due date was in August  and the baby came early. Per mother she now lives in [place] with her boyfriend. Per mother they are staying in the home of her boyfriend's brother and wife. Mother reported that she has 5 children ages 25, 43, 63, 24 months and the infant. Per mother the infant, 27 month old and 41 y.o live with her. Per mother the 62 y.o and 14 y.o live with their maternal grandmother. Mother reported that she is not depressed and is not having thoughts about hurting herself or anybody else. CSW provided mother with mental health resources including [facility], [facility] and [facility] Mood disorder clinic for women. Mother accepted resources. Mother reported that she used marijuana throughout her pregnancy and last used 1 month ago. Mother reported that she has stopped using marijuana because she wants to breastfeed. Mother reported that she did not use marijuana around her children or while they were under her supervision. Mother reported that she does not use any other drugs and does not drink alcohol. CSW explained that if infant's cord tissue is positive for marijuana or other illicit substances then a child protective services (CPS) report  will have to made. Mother verbalized her understanding. CSW will continue to follow and assist as needed.    CSW Plan/Description:  CSW Will Continue to Monitor Umbilical Cord Tissue Drug Screen Results and Make Report if [facility], Lenice Llamas 01/24/2019, 4:26 PM

## 2019-01-24 NOTE — Anesthesia Post-op Follow-up Note (Signed)
  Anesthesia Pain Follow-up Note  Patient: Judith Reed  Day #: 1  Date of Follow-up: 01/24/2019 Time: 7:28 AM  Last Vitals:  Vitals:   01/24/19 0500 01/24/19 0726  BP:  123/65  Pulse:  73  Resp:  18  Temp:  36.6 C  SpO2: 99% 100%    Level of Consciousness: alert  Pain: none   Side Effects:None  Catheter Site Exam:clean, dry, no drainage     Plan: D/C from anesthesia care at surgeon's request  Hedda Slade

## 2019-01-24 NOTE — Lactation Note (Signed)
This note was copied from a baby's chart. Lactation Consultation Note  Patient Name: Judith Reed Date: 01/24/2019 Reason for consult: Follow-up assessment   Maternal Data    Feeding Feeding Type: Breast Fed  LATCH Score                   Interventions    Lactation Tools Discussed/Used     Consult Status Consult Status: Complete Follow-up type: Call as needed  Judith Reed went to speak with MOB about how feedings have been going and to see if she had any breastfeeding questions or concerns. MOB stated that this is her 5th baby and 5th time breastfeeding. Baby is clusterfeeding and MOB asked for a manual pump to have for when she goes home. MOB states that she puts baby to breast, but typically has an abundant milk supply and needs to hand express often for relief. LC made sure MOB knew how to use manual pump and reviewed cleaning and milk storage guidelines. LC also told MOB where and how to receive bf help after d/c.   Judith Reed 01/24/2019, 10:29 AM

## 2019-01-25 LAB — SARS CORONAVIRUS 2 BY RT PCR (HOSPITAL ORDER, PERFORMED IN ~~LOC~~ HOSPITAL LAB): SARS Coronavirus 2: NEGATIVE

## 2019-01-25 LAB — ABO/RH: ABO/RH(D): B POS

## 2019-01-25 MED ORDER — DOCUSATE SODIUM 100 MG PO CAPS: 100.0000 mg | ORAL_CAPSULE | Freq: Every day | ORAL | 2 refills | Status: AC | PRN

## 2019-01-25 MED ORDER — OXYCODONE-ACETAMINOPHEN 5-325 MG PO TABS: 1.0000 | ORAL_TABLET | Freq: Four times a day (QID) | ORAL | 0 refills | Status: AC | PRN

## 2019-01-25 MED ORDER — GABAPENTIN 300 MG PO CAPS: 300.0000 mg | ORAL_CAPSULE | Freq: Every day | ORAL | 0 refills | Status: DC

## 2019-01-25 MED ORDER — IBUPROFEN 800 MG PO TABS: 800.0000 mg | ORAL_TABLET | Freq: Four times a day (QID) | ORAL | 0 refills | Status: DC

## 2019-01-25 NOTE — Progress Notes (Signed)
Patient discharged home with infant this evening due to family not getting off work until later today. Discharge instructions and prescriptions given and reviewed with patient. Patient verbalized understanding. Will be escorted out by staff.

## 2019-01-25 NOTE — Progress Notes (Signed)
COVID-19 NEGATIVE 02/23/2019 per lab log book. Error in lab for inputting results for patient. Provider paged to be updated.

## 2019-01-26 LAB — TYPE AND SCREEN
ABO/RH(D): B POS
Antibody Screen: POSITIVE
Unit division: 0
Unit division: 0

## 2019-01-26 LAB — BPAM RBC
Blood Product Expiration Date: 202008292359
Blood Product Expiration Date: 202008292359
Unit Type and Rh: 5100
Unit Type and Rh: 5100

## 2019-01-30 NOTE — Progress Notes (Signed)
01/30/2019: Infant's cord tissue screen was positive for THC and benzoylecgonine. Clinical Social Worker (CSW) made a Child Protective Services (CPS) report in [place] today 01/30/2019.   Maciah Schweigert, LCSW [phone]    

## 2019-01-31 ENCOUNTER — Emergency Department: Payer: Medicaid Other

## 2019-01-31 ENCOUNTER — Other Ambulatory Visit: Payer: Self-pay

## 2019-01-31 ENCOUNTER — Inpatient Hospital Stay
Admission: EM | Admit: 2019-01-31 | Discharge: 2019-02-01 | DRG: 776 | Disposition: A | Discharge status: Home / Self Care | Payer: Medicaid Other | Attending: Obstetrics and Gynecology | Admitting: Obstetrics and Gynecology

## 2019-01-31 DIAGNOSIS — O1495 Unspecified pre-eclampsia, complicating the puerperium: Secondary | ICD-10-CM

## 2019-01-31 DIAGNOSIS — Z20828 Contact with and (suspected) exposure to other viral communicable diseases: Secondary | ICD-10-CM | POA: Diagnosis present

## 2019-01-31 DIAGNOSIS — O99335 Smoking (tobacco) complicating the puerperium: Secondary | ICD-10-CM | POA: Diagnosis present

## 2019-01-31 DIAGNOSIS — O1415 Severe pre-eclampsia, complicating the puerperium: Principal | ICD-10-CM | POA: Diagnosis present

## 2019-01-31 DIAGNOSIS — H538 Other visual disturbances: Secondary | ICD-10-CM | POA: Diagnosis present

## 2019-01-31 DIAGNOSIS — F17210 Nicotine dependence, cigarettes, uncomplicated: Secondary | ICD-10-CM | POA: Diagnosis present

## 2019-01-31 HISTORY — DX: Unspecified convulsions: R56.9

## 2019-01-31 HISTORY — DX: Unspecified pre-eclampsia, unspecified trimester: O14.90

## 2019-01-31 LAB — COMPREHENSIVE METABOLIC PANEL
ALT: 15 U/L (ref 0–44)
ALT: 15 U/L (ref 0–44)
AST: 22 U/L (ref 15–41)
AST: 22 U/L (ref 15–41)
Albumin: 3.1 g/dL — ABNORMAL LOW (ref 3.5–5.0)
Albumin: 3 g/dL — ABNORMAL LOW (ref 3.5–5.0)
Alkaline Phosphatase: 62 U/L (ref 38–126)
Alkaline Phosphatase: 62 U/L (ref 38–126)
Anion gap: 8 (ref 5–15)
Anion gap: 10 (ref 5–15)
BUN: 9 mg/dL (ref 6–20)
BUN: 7 mg/dL (ref 6–20)
CO2: 22 mmol/L (ref 22–32)
CO2: 18 mmol/L — ABNORMAL LOW (ref 22–32)
Calcium: 8.4 mg/dL — ABNORMAL LOW (ref 8.9–10.3)
Calcium: 7.5 mg/dL — ABNORMAL LOW (ref 8.9–10.3)
Chloride: 108 mmol/L (ref 98–111)
Chloride: 107 mmol/L (ref 98–111)
Creatinine, Ser: 0.64 mg/dL (ref 0.44–1.00)
Creatinine, Ser: 0.53 mg/dL (ref 0.44–1.00)
GFR calc Af Amer: >60 mL/min (ref >60)
GFR calc Af Amer: >60 mL/min (ref >60)
GFR calc non Af Amer: >60 mL/min (ref >60)
GFR calc non Af Amer: >60 mL/min (ref >60)
Glucose, Bld: 80 mg/dL (ref 70–99)
Glucose, Bld: 135 mg/dL — ABNORMAL HIGH (ref 70–99)
Potassium: 4.1 mmol/L (ref 3.5–5.1)
Potassium: 2.9 mmol/L — ABNORMAL LOW (ref 3.5–5.1)
Sodium: 138 mmol/L (ref 135–145)
Sodium: 135 mmol/L (ref 135–145)
Total Bilirubin: 0.5 mg/dL (ref 0.3–1.2)
Total Bilirubin: 0.5 mg/dL (ref 0.3–1.2)
Total Protein: 6.3 g/dL — ABNORMAL LOW (ref 6.5–8.1)
Total Protein: 6.2 g/dL — ABNORMAL LOW (ref 6.5–8.1)

## 2019-01-31 LAB — URINALYSIS, COMPLETE (UACMP) WITH MICROSCOPIC
Bacteria, UA: NONE SEEN
Bilirubin Urine: NEGATIVE
Glucose, UA: NEGATIVE mg/dL
Ketones, ur: NEGATIVE mg/dL
Leukocytes,Ua: NEGATIVE
Nitrite: NEGATIVE
Protein, ur: NEGATIVE mg/dL
RBC / HPF: >50 RBC/hpf — ABNORMAL HIGH (ref 0–5)
Specific Gravity, Urine: >1.046 — ABNORMAL HIGH (ref 1.005–1.030)
pH: 7 (ref 5.0–8.0)

## 2019-01-31 LAB — CBC WITH DIFFERENTIAL/PLATELET
Abs Immature Granulocytes: 0.19 10*3/uL — ABNORMAL HIGH (ref 0.00–0.07)
Basophils Absolute: 0 10*3/uL (ref 0.0–0.1)
Basophils Relative: 0 %
Eosinophils Absolute: 0.2 10*3/uL (ref 0.0–0.5)
Eosinophils Relative: 2 %
HCT: 33 % — ABNORMAL LOW (ref 36.0–46.0)
Hemoglobin: 9.9 g/dL — ABNORMAL LOW (ref 12.0–15.0)
Immature Granulocytes: 2 %
Lymphocytes Relative: 21 %
Lymphs Abs: 2.1 10*3/uL (ref 0.7–4.0)
MCH: 23.9 pg — ABNORMAL LOW (ref 26.0–34.0)
MCHC: 30 g/dL (ref 30.0–36.0)
MCV: 79.5 fL — ABNORMAL LOW (ref 80.0–100.0)
Monocytes Absolute: 0.6 10*3/uL (ref 0.1–1.0)
Monocytes Relative: 6 %
Neutro Abs: 7.1 10*3/uL (ref 1.7–7.7)
Neutrophils Relative %: 69 %
Platelets: 362 10*3/uL (ref 150–400)
RBC: 4.15 MIL/uL (ref 3.87–5.11)
RDW: 20.4 % — ABNORMAL HIGH (ref 11.5–15.5)
WBC: 10.3 10*3/uL (ref 4.0–10.5)
nRBC: 0 % (ref 0.0–0.2)

## 2019-01-31 LAB — PROTEIN / CREATININE RATIO, URINE
Creatinine, Urine: 92 mg/dL
Protein Creatinine Ratio: 0.12 mg/mg{Cre} (ref 0.00–0.15)
Total Protein, Urine: 11 mg/dL

## 2019-01-31 LAB — LACTATE DEHYDROGENASE: LDH: 278 U/L — ABNORMAL HIGH (ref 98–192)

## 2019-01-31 LAB — CBC
HCT: 34.6 % — ABNORMAL LOW (ref 36.0–46.0)
Hemoglobin: 10.4 g/dL — ABNORMAL LOW (ref 12.0–15.0)
MCH: 23.6 pg — ABNORMAL LOW (ref 26.0–34.0)
MCHC: 30.1 g/dL (ref 30.0–36.0)
MCV: 78.5 fL — ABNORMAL LOW (ref 80.0–100.0)
Platelets: 387 10*3/uL (ref 150–400)
RBC: 4.41 MIL/uL (ref 3.87–5.11)
RDW: 20.4 % — ABNORMAL HIGH (ref 11.5–15.5)
WBC: 11 10*3/uL — ABNORMAL HIGH (ref 4.0–10.5)
nRBC: 0 % (ref 0.0–0.2)

## 2019-01-31 LAB — MAGNESIUM
Magnesium: 2 mg/dL (ref 1.7–2.4)
Magnesium: 3.9 mg/dL — ABNORMAL HIGH (ref 1.7–2.4)

## 2019-01-31 LAB — URIC ACID: Uric Acid, Serum: 5.2 mg/dL (ref 2.5–7.1)

## 2019-01-31 LAB — SARS CORONAVIRUS 2 BY RT PCR (HOSPITAL ORDER, PERFORMED IN ~~LOC~~ HOSPITAL LAB): SARS Coronavirus 2: NEGATIVE

## 2019-01-31 MED ORDER — IBUPROFEN 600 MG PO TABS
ORAL_TABLET | ORAL | Status: AC
  Filled 2019-01-31: qty 1

## 2019-01-31 MED ORDER — HYDRALAZINE HCL 20 MG/ML IJ SOLN: 5.0000 mg | INTRAMUSCULAR | Status: DC | PRN

## 2019-01-31 MED ORDER — SODIUM CHLORIDE 0.9 % IV BOLUS
1000.0000 mL | Freq: Once | INTRAVENOUS | Status: AC
  Administered 2019-01-31: 14:00:00 1000 mL via INTRAVENOUS

## 2019-01-31 MED ORDER — LABETALOL HCL 5 MG/ML IV SOLN: 40.0000 mg | INTRAVENOUS | Status: DC | PRN

## 2019-01-31 MED ORDER — CALCIUM GLUCONATE 10 % IV SOLN
INTRAVENOUS | Status: AC
  Filled 2019-01-31: qty 10

## 2019-01-31 MED ORDER — POTASSIUM CHLORIDE CRYS ER 10 MEQ PO TBCR
20.0000 meq | EXTENDED_RELEASE_TABLET | Freq: Three times a day (TID) | ORAL | Status: DC
  Administered 2019-01-31 – 2019-02-01 (×2): 20 meq via ORAL
  Filled 2019-01-31 (×2): qty 2

## 2019-01-31 MED ORDER — HYDRALAZINE HCL 20 MG/ML IJ SOLN: 10.0000 mg | INTRAMUSCULAR | Status: DC | PRN

## 2019-01-31 MED ORDER — MAGNESIUM SULFATE BOLUS VIA INFUSION
4.0000 g | Freq: Once | INTRAVENOUS | Status: AC
  Administered 2019-01-31: 16:00:00 4 g via INTRAVENOUS
  Filled 2019-01-31: qty 500

## 2019-01-31 MED ORDER — HYDRALAZINE HCL 20 MG/ML IJ SOLN
5.0000 mg | Freq: Once | INTRAMUSCULAR | Status: AC
  Administered 2019-01-31: 14:00:00 5 mg via INTRAVENOUS
  Filled 2019-01-31: qty 1

## 2019-01-31 MED ORDER — ACETAMINOPHEN 500 MG PO TABS
1000.0000 mg | ORAL_TABLET | Freq: Once | ORAL | Status: AC
  Administered 2019-01-31: 12:00:00 1000 mg via ORAL
  Filled 2019-01-31: qty 2

## 2019-01-31 MED ORDER — HYDRALAZINE HCL 20 MG/ML IJ SOLN
10.0000 mg | Freq: Once | INTRAMUSCULAR | Status: AC
  Administered 2019-01-31: 15:00:00 10 mg via INTRAVENOUS
  Filled 2019-01-31: qty 1

## 2019-01-31 MED ORDER — LABETALOL HCL 5 MG/ML IV SOLN: 20.0000 mg | INTRAVENOUS | Status: DC | PRN

## 2019-01-31 MED ORDER — OXYCODONE HCL 5 MG PO TABS
5.0000 mg | ORAL_TABLET | Freq: Four times a day (QID) | ORAL | Status: DC | PRN
  Administered 2019-01-31 – 2019-02-01 (×3): 5 mg via ORAL
  Filled 2019-01-31 (×2): qty 1

## 2019-01-31 MED ORDER — IBUPROFEN 600 MG PO TABS
600.0000 mg | ORAL_TABLET | Freq: Four times a day (QID) | ORAL | Status: DC | PRN
  Administered 2019-01-31 – 2019-02-01 (×2): 600 mg via ORAL
  Filled 2019-01-31: qty 1

## 2019-01-31 MED ORDER — LACTATED RINGERS IV SOLN
INTRAVENOUS | Status: DC
  Administered 2019-01-31: 16:00:00 1000 mL via INTRAVENOUS
  Administered 2019-01-31 – 2019-02-01 (×2): via INTRAVENOUS

## 2019-01-31 MED ORDER — MAGNESIUM SULFATE 40 G IN LACTATED RINGERS - SIMPLE
2.0000 g/h | INTRAVENOUS | Status: DC
  Administered 2019-02-01: 09:00:00 2 g/h via INTRAVENOUS
  Filled 2019-01-31: qty 500

## 2019-01-31 MED ORDER — OXYCODONE HCL 5 MG PO TABS
ORAL_TABLET | ORAL | Status: AC
  Filled 2019-01-31: qty 1

## 2019-01-31 MED ORDER — FENTANYL CITRATE (PF) 100 MCG/2ML IJ SOLN
50.0000 ug | Freq: Once | INTRAMUSCULAR | Status: AC
  Administered 2019-01-31: 12:00:00 50 ug via INTRAVENOUS
  Filled 2019-01-31: qty 2

## 2019-01-31 MED ORDER — ACETAMINOPHEN 500 MG PO TABS
1000.0000 mg | ORAL_TABLET | Freq: Three times a day (TID) | ORAL | Status: DC | PRN
  Administered 2019-01-31: 22:00:00 1000 mg via ORAL
  Filled 2019-01-31: qty 2

## 2019-01-31 MED ORDER — IOHEXOL 350 MG/ML SOLN
75.0000 mL | Freq: Once | INTRAVENOUS | Status: AC | PRN
  Administered 2019-01-31: 12:00:00 75 mL via INTRAVENOUS

## 2019-01-31 MED ORDER — MAGNESIUM SULFATE 40 G IN LACTATED RINGERS - SIMPLE
INTRAVENOUS | Status: AC
  Filled 2019-01-31: qty 500

## 2019-01-31 NOTE — ED Triage Notes (Signed)
Is 1 week post partum and started this am with right sided chest pain that radiates into shoulder - pt also c/o shortness of breath - this pregnancy no preeclampsia but did have it with prior pregnancy

## 2019-01-31 NOTE — ED Provider Notes (Signed)
[facility] Emergency Department Provider Note  ____________________________________________   First MD Initiated Contact with Patient 01/31/19 1034     (approximate)  I have reviewed the triage vital signs and the nursing notes.  HISTORY  Chief Complaint Chest Pain and Shortness of Breath    HPI Judith Reed is a 28 y.o. female status post C-section on 01/23/19 who presents with 3 days of intermittent frontal headache.  She reports some mild associated blurry vision, as well as high blood pressure on arrival.  She states she has no history of high blood pressure or preeclampsia in this pregnancy, but does have a history of preeclampsia as well as eclamptic seizure in a prior pregnancy.  She states this does feel similar.  She also reports since waking up this morning she has right-sided chest pain and discomfort.  Pain is worsened when laying on the side and when she takes a big deep breath in.  She denies any history of DVT or PE.         Past Medical Hx Past Medical History:  Diagnosis Date  . Preeclampsia   . Seizures [facility])     Problem List Patient Active Problem List   Diagnosis Date Noted  . Delivery by cesarean section at 37-39 weeks of gestation due to labor 01/23/2019  . Delivery of fifth pregnancy by cesarean section using transverse incision of lower segment of uterus 01/23/2019    Past Surgical Hx Past Surgical History:  Procedure Laterality Date  . CESAREAN SECTION    . CESAREAN SECTION N/A 01/23/2019   Procedure: CESAREAN SECTION;  Surgeon: Benjaman Kindler, MD;  Location: [facility] ORS;  Service: Obstetrics;  Laterality: N/A;  . CESAREAN SECTION      Medications Prior to Admission medications   Medication Sig Start Date End Date Taking? Authorizing Provider  docusate sodium (COLACE) 100 MG capsule Take 1 capsule (100 mg total) by mouth daily as needed. 01/25/19 01/25/20  Schermerhorn, Gwen Her, MD  gabapentin (NEURONTIN) 300 MG  capsule Take 1 capsule (300 mg total) by mouth at bedtime. 01/25/19   Schermerhorn, Gwen Her, MD  ibuprofen (ADVIL) 800 MG tablet Take 1 tablet (800 mg total) by mouth every 6 (six) hours. 01/25/19   Schermerhorn, Gwen Her, MD  oxyCODONE-acetaminophen (PERCOCET) 5-325 MG tablet Take 1-2 tablets by mouth every 6 (six) hours as needed for severe pain. 01/25/19 01/25/20  Schermerhorn, Gwen Her, MD  Prenatal Vit-Fe Fumarate-FA (MULTIVITAMIN-PRENATAL) 27-0.8 MG TABS tablet Take 1 tablet by mouth daily at 12 noon.    [provider]    Allergies Patient has no known allergies.  Family Hx No family history on file.  Social Hx Social History   Tobacco Use  . Smoking status: Current Every Day Smoker    Packs/day: 0.50    Types: Cigarettes  . Smokeless tobacco: Former Network engineer Use Topics  . Alcohol use: Never    Frequency: Never  . Drug use: Not Currently    Types: Marijuana    Review of Systems  Constitutional: No fever. No chills. Eyes: + blurry vision ENT: No sore throat. Cardiovascular: + chest pain. Respiratory: + shortness of breath. Gastrointestinal: No abdominal pain. No nausea. No vomiting. Genitourinary: No dysuria. Musculoskeletal: No back pain. Skin: No rash. Neurological: + for headaches.  ____________________________________________   PHYSICAL EXAM:  Vital Signs: ED Triage Vitals  Enc Vitals Group     BP 01/31/19 1030 (!) 160/87     Pulse Rate 01/31/19 1030 (!)  46     Resp 01/31/19 1030 19     Temp 01/31/19 1030 98.4 F (36.9 C)     Temp Source 01/31/19 1030 Oral     SpO2 01/31/19 1030 100 %     Weight 01/31/19 1027 140 lb (63.5 kg)     Height 01/31/19 1027 5\' 1"  (1.549 m)     Head Circumference --      Peak Flow --      Pain Score 01/31/19 1027 10     Pain Loc --      Pain Edu? --      Excl. in GC? --     Constitutional: Alert and oriented.  Eyes: Conjunctivae are normal. Sclera anicteric. Head: Normocephalic. Atraumatic. Nose: No  congestion. No rhinorrhea. Mouth/Throat: Mucous membranes are moist.  Neck: No stridor.  Full range of motion without discomfort. Cardiovascular: Bradycardic, regular rhythm. Grossly normal heart sounds.  Good peripheral perfusion. Respiratory: Normal respiratory effort.  No retractions. Lungs CTAB. Gastrointestinal: Soft and non-tender. No distention.  Musculoskeletal: No lower extremity edema. Neurologic:  Normal speech and language. No gross focal neurologic deficits are appreciated.  Skin: Skin is warm, dry and intact. No rash noted. Psychiatric: Mood and affect are appropriate for situation.  ____________________________________________  EKG  Personally reviewed.  Sinus rhythm.  Bradycardic, heart rate 45.  No acute ischemic changes.  Normal axis.  ____________________________________________  RADIOLOGY  CT chest negative for PE.   ____________________________________________   PROCEDURES  Procedure(s) performed (including Critical Care):  .Critical Care Performed by: Miguel AschoffMonks, Sarah L., MD Authorized by: Miguel AschoffMonks, Sarah L., MD   Critical care provider statement:    Critical care time (minutes):  30   Critical care was necessary to treat or prevent imminent or life-threatening deterioration of the following conditions: Preeclampsia with severe range blood pressure.   Critical care was time spent personally by me on the following activities:  Discussions with consultants, evaluation of patient's response to treatment, examination of patient, ordering and performing treatments and interventions, ordering and review of laboratory studies, ordering and review of radiographic studies, pulse oximetry, re-evaluation of patient's condition, obtaining history from patient or surrogate and review of old charts     ____________________________________________   INITIAL IMPRESSION / ASSESSMENT AND PLAN / ED COURSE  28 y.o. female with history of C-section on 01/23/2019 who presents  to the ED for 2 complaints.  First, she complains of intermittent headache x3 days in the setting of elevated blood pressure.  She also complains of right-sided cardiac chest pain and shortness of breath.  On exam, blood pressure 160/87.  Bradycardic at 46.  Satting well on room air.  No significant leg asymmetry or edema.  First concern for possible postpartum preeclampsia versus tension headache, especially given her history and current blood pressure.  Second concern regarding her right-sided neck and pleuritic chest pain, which may be musculoskeletal in etiology, however given her recent pregnancy, concern for possible PE.  Plan: labs, imaging, symptom control, reassess blood pressure with headache control.  CT negative for PE.  Blood pressure remains elevated despite symptomatic treatment of headache.  Discussed case with OB, who agreed with concern for preeclampsia.  Will give IV hydralazine here for blood pressure control (as opposed to labetalol given her initial bradycardia).  They will admit to L&D, OB will plan to start magnesium on L&D.  ____________________________________________   FINAL CLINICAL IMPRESSION(S) / ED DIAGNOSES  Final diagnoses:  Pre-eclampsia in postpartum period     Note:  This document was prepared using Dragon voice recognition software and may include unintentional dictation errors.   Miguel AschoffMonks, Sarah L., MD 01/31/19 [id number]

## 2019-01-31 NOTE — H&P (Signed)
Consult History and Physical   SERVICE: Ob/Gyn  Patient Name: Judith Reed Patient MRN:   [id number]  CC: 3 days of worsening headache with blurred vision  HPI: Judith Reed is a 28 y.o. [id number] at 8days postpartum. She is s/p repeat  LTCS on 01/23/19 at term.   - OB hx: remarkable for hx eclamptic seizure with G3 and subsequent delivery. Hx CS x 5.   - no elevated BP during recent admission for delivery.  - current tobacco user, prior use of MJ - presented to ER this am at 1030 with severe HA and noted to have severe range BPs 160-190s/70-90s and bradycardia.  - CT of chest done with no e/o pulm embolus.  - EKG done- bradycardia, no heart block.  - s/p 2 doses of hydralazine in ER with 1st dose around 1330   Review of Systems: positives in bold GEN:   fevers, chills, weight changes, appetite changes, fatigue, night sweats HEENT:  HA, vision changes, hearing loss, congestion, rhinorrhea, sinus pressure, dysphagia CV:   CP intermittent right side, palpitations PULM:  SOB, cough GI:  abd pain, N/V/D/C, mild incisional pain.  GU:  dysuria, urgency, frequency, scant lochia MSK:  arthralgias, myalgias, back pain, swelling SKIN:  rashes, color changes, pallor NEURO:  numbness, weakness, tingling, seizures, dizziness, tremors PSYCH:  depression, anxiety, behavioral problems, confusion  HEME/LYMPH:  easy bruising or bleeding ENDO:  heat/cold intolerance  Past Obstetrical History: OB History    Gravida  5   Para  5   Term  4   Preterm  1   AB      Living  5     SAB      TAB      Ectopic      Multiple  0   Live Births  5           Past Gynecologic History: No LMP recorded. approx LMP 04/29/2018 unsure and recent preg dated by US.   Past Medical History: Past Medical History:  Diagnosis Date  . Preeclampsia   . Seizures (HCC)     Past Surgical History:   Past Surgical History:  Procedure Laterality Date  . CESAREAN SECTION    . CESAREAN SECTION  N/A 01/23/2019   Procedure: CESAREAN SECTION;  Surgeon: Christeen DouglasBeasley, Bethany, MD;  Location: [facility] ORS;  Service: Obstetrics;  Laterality: N/A;  . CESAREAN SECTION      Family History:  family history is not on file.  Social History:  Social History   Socioeconomic History  . Marital status: Single    Spouse name: Not on file  . Number of children: Not on file  . Years of education: Not on file  . Highest education level: Not on file  Occupational History  . Not on file  Social Needs  . Financial resource strain: Not hard at all  . Food insecurity    Worry: Never true    Inability: Never true  . Transportation needs    Medical: No    Non-medical: No  Tobacco Use  . Smoking status: Current Every Day Smoker    Packs/day: 0.50    Types: Cigarettes  . Smokeless tobacco: Former Engineer, waterUser  Substance and Sexual Activity  . Alcohol use: Never    Frequency: Never  . Drug use: Not Currently    Types: Marijuana  . Sexual activity: Yes  Lifestyle  . Physical activity    Days per week: 0 days    Minutes per session: 0 min  .  Stress: Not at all  Relationships  . Social connections    Talks on phone: More than three times a week    Gets together: More than three times a week    Attends religious service: Never    Active member of club or organization: No    Attends meetings of clubs or organizations: Never    Relationship status: Not on file  . Intimate partner violence    Fear of current or ex partner: No    Emotionally abused: No    Physically abused: No    Forced sexual activity: No  Other Topics Concern  . Not on file  Social History Narrative  . Not on file    Home Medications:  Medications reconciled in EPIC  No current facility-administered medications on file prior to encounter.    Current Outpatient Medications on File Prior to Encounter  Medication Sig Dispense Refill  . docusate sodium (COLACE) 100 MG capsule Take 1 capsule (100 mg total) by mouth daily as needed.  30 capsule 2  . folic acid (FOLVITE) 400 MCG tablet Take 400 mcg by mouth daily.    Marland Kitchen. ibuprofen (ADVIL) 800 MG tablet Take 1 tablet (800 mg total) by mouth every 6 (six) hours. 30 tablet 0  . oxyCODONE-acetaminophen (PERCOCET) 5-325 MG tablet Take 1-2 tablets by mouth every 6 (six) hours as needed for severe pain. 20 tablet 0  . Prenatal Vit-Fe Fumarate-FA (MULTIVITAMIN-PRENATAL) 27-0.8 MG TABS tablet Take 1 tablet by mouth daily at 12 noon.      Allergies:  No Known Allergies  Physical Exam:  Temp:  [98.4 F (36.9 C)-98.7 F (37.1 C)] 98.5 F (36.9 C) (08/04 1932) Pulse Rate:  [42-99] 87 (08/04 2104) Resp:  [17-25] 18 (08/04 2104) BP: (127-199)/(69-96) 144/80 (08/04 2104) SpO2:  [97 %-100 %] 97 % (08/04 1931) Weight:  [63.5 kg] 63.5 kg (08/04 1027)   General Appearance:  Well developed, well nourished, no acute distress, alert and oriented x3 HEENT:  Normocephalic atraumatic, extraocular movements intact, moist mucous membranes Cardiovascular:  Normal S1/S2, regular rate and rhythm, no murmurs Pulmonary:  clear to auscultation, no wheezes, rales or rhonchi, symmetric air entry, good air exchange Abdomen:  Bowel sounds present, soft, nontender, nondistended, no abnormal masses, no epigastric pain; Pfannienstiel incision intact, no erythema or drainage. Fundus firm, 142fb above pubic symphysis.   Extremities:  Full range of motion, no pedal edema, 2+ distal pulses, no tenderness Skin:  normal coloration and turgor, no rashes, no suspicious skin lesions noted  Neurologic:  3+ DTRs, no clonus, normal muscle tone, strength 5/5 all four extremities Psychiatric:  Normal mood and affect, appropriate, no AH/VH Pelvic:  Deferred   Labs/Studies:   CBC and Coags:  Lab Results  Component Value Date   WBC 11.0 (H) 01/31/2019   NEUTOPHILPCT 69 01/31/2019   EOSPCT 2 01/31/2019   BASOPCT 0 01/31/2019   LYMPHOPCT 21 01/31/2019   HGB 10.4 (L) 01/31/2019   HCT 34.6 (L) 01/31/2019   MCV 78.5  (L) 01/31/2019   PLT 387 01/31/2019   CMP:  Lab Results  Component Value Date   NA 135 01/31/2019   K 2.9 (L) 01/31/2019   CL 107 01/31/2019   CO2 18 (L) 01/31/2019   BUN 7 01/31/2019   CREATININE 0.53 01/31/2019   CREATININE 0.64 01/31/2019   CREATININE 0.57 01/23/2019   PROT 6.2 (L) 01/31/2019   BILITOT 0.5 01/31/2019   ALT 15 01/31/2019   AST 22 01/31/2019   ALKPHOS  62 01/31/2019   Other Labs: P/C ratio: 0.12  Other Imaging: Ct Angio Chest Pe W And/or Wo Contrast  Result Date: 01/31/2019 CLINICAL DATA:  Chest pain.  One week postpartum EXAM: CT ANGIOGRAPHY CHEST WITH CONTRAST TECHNIQUE: Multidetector CT imaging of the chest was performed using the standard protocol during bolus administration of intravenous contrast. Multiplanar CT image reconstructions and MIPs were obtained to evaluate the vascular anatomy. CONTRAST:  71mL OMNIPAQUE IOHEXOL 350 MG/ML SOLN COMPARISON:  None. FINDINGS: Cardiovascular: There is no demonstrable pulmonary embolus. There is no thoracic aortic aneurysm or dissection. The visualized great vessels appear unremarkable. There is no pericardial effusion or pericardial thickening. Mediastinum/Nodes: Thyroid appears normal. There is thymic tissue present in the anterior mediastinum, within normal limits for age. No evident thoracic adenopathy. No esophageal lesions are evident. Lungs/Pleura: There is atelectatic change in the posterior lung bases. No evident edema or consolidation. No pleural effusions are evident. Upper Abdomen: Visualized upper abdominal structures are normal in appearance. Musculoskeletal: There are no blastic or lytic bone lesions. No evident chest wall lesions. Review of the MIP images confirms the above findings. IMPRESSION: 1. No demonstrable pulmonary embolus. No thoracic aortic aneurysm or dissection. 2.  Slight bibasilar atelectasis.  No edema or consolidation. 3.  No evident adenopathy. Electronically Signed   By: Lowella Grip III  M.D.   On: 01/31/2019 12:20     Assessment / Plan:   Judith Reed is a 28 y.o. [id number] who presents with postpartum pre-eclampsia with severe features  Consult with attending physician on call- Dr Ouida Sills - Mag sulfate 4gm bolus then 2gm/hr, plan to continue for 24hrs - Hydralazine for severe range BPs; mild range since 2 doses of antihypertensive given in ER.  - monitor strict I/Os- urine output 976ml over 4hrs since arrival. 1 unmeasured void on arrival to unit.  - repeat labs done at 1900: hypokalemia noted, PO potassium 43meq TID ordered for replacement. Plan repeat labs in AM - regular diet, bedside commode, lactation to bedside- pt is breastfeeding and infant remains at home, pump provided for double pumping.   Francetta Found, CNM 01/31/2019  10:18 PM

## 2019-01-31 NOTE — ED Notes (Signed)
Called L&D and pt will be moved to LDR #5

## 2019-02-01 LAB — COMPREHENSIVE METABOLIC PANEL
ALT: 13 U/L (ref 0–44)
AST: 17 U/L (ref 15–41)
Albumin: 3.2 g/dL — ABNORMAL LOW (ref 3.5–5.0)
Alkaline Phosphatase: 66 U/L (ref 38–126)
Anion gap: 8 (ref 5–15)
BUN: 7 mg/dL (ref 6–20)
CO2: 23 mmol/L (ref 22–32)
Calcium: 6.5 mg/dL — ABNORMAL LOW (ref 8.9–10.3)
Chloride: 110 mmol/L (ref 98–111)
Creatinine, Ser: 0.49 mg/dL (ref 0.44–1.00)
GFR calc Af Amer: >60 mL/min (ref >60)
GFR calc non Af Amer: >60 mL/min (ref >60)
Glucose, Bld: 90 mg/dL (ref 70–99)
Potassium: 4.2 mmol/L (ref 3.5–5.1)
Sodium: 141 mmol/L (ref 135–145)
Total Bilirubin: 0.2 mg/dL — ABNORMAL LOW (ref 0.3–1.2)
Total Protein: 6.6 g/dL (ref 6.5–8.1)

## 2019-02-01 LAB — MAGNESIUM: Magnesium: 5.8 mg/dL — ABNORMAL HIGH (ref 1.7–2.4)

## 2019-02-01 MED ORDER — NIFEDIPINE ER OSMOTIC RELEASE 30 MG PO TB24: 30.0000 mg | ORAL_TABLET | Freq: Every day | ORAL | 2 refills | Status: AC

## 2019-02-01 MED ORDER — BUTALBITAL-APAP-CAFFEINE 50-325-40 MG PO TABS
2.0000 | ORAL_TABLET | Freq: Four times a day (QID) | ORAL | Status: DC | PRN
  Filled 2019-02-01: qty 2

## 2019-02-01 MED ORDER — BUTALBITAL-APAP-CAFFEINE 50-325-40 MG PO TABS
1.0000 | ORAL_TABLET | Freq: Four times a day (QID) | ORAL | Status: DC | PRN
  Administered 2019-02-01 (×2): 1 via ORAL
  Filled 2019-02-01: qty 1

## 2019-02-01 NOTE — Progress Notes (Signed)
Pt headache significant improved with coffee and 1 tablet of Fioricet rating 2/10 compared to 8/10 10/10 all night long. BP stable 147/87. Pt is pumping q2-3 hrs, RN is storing pt breast milk in fridge. Plan to d/c mag at 1700 and monitor BP closely before discharge.

## 2019-02-01 NOTE — Discharge Summary (Signed)
Obstetric Discharge Summary   Patient ID: Judith Reed MRN: [id number] DOB/AGE: Jul 25, 1990 28 y.o.   Date of Admission: 01/31/2019  Date of Discharge:  02/01/19   Admitting Diagnosis: Severe PreE, by blood pressure and HA  Secondary Diagnosis:  - OB hx: remarkable for hx eclamptic seizure with G3 and subsequent delivery. Hx CS x 5.   - no elevated BP during recent admission for delivery.  - current tobacco user, prior use of MJ     Discharge Diagnosis: Severe PreE, resolved  Brief Hospital Course   on): Judith Reed is a [id number] who underwent cesarean section on 01/23/2019.   - presented to ER with severe HA and noted to have severe range BPs 160-190s/70-90s and bradycardia.  - CT of chest done with no e/o pulm embolus.  - EKG done- bradycardia, no heart block.  - s/p 2 doses of hydralazine in ER with mild range pressures - received mag x24hrs with no further severe pressures - HA resolved with caffeine - pt discharged home with 30mg  procardia and planned BP check in 36hrs  Labs: CBC Latest Ref Rng & Units 01/31/2019 01/31/2019 01/24/2019  WBC 4.0 - 10.5 K/uL 11.0(H) 10.3 14.8(H)  Hemoglobin 12.0 - 15.0 g/dL 10.4(L) 9.9(L) 8.3(L)  Hematocrit 36.0 - 46.0 % 34.6(L) 33.0(L) 27.2(L)  Platelets 150 - 400 K/uL 387 362 183   B POS Performed at [facility], [address] [place], [place] [address]   Physical exam:  Blood pressure (!) 142/80, pulse 63, temperature 98.4 F (36.9 C), temperature source Oral, resp. rate 18, height 5\' 1"  (1.549 m), weight 63.5 kg, SpO2 97 %, unknown if currently breastfeeding. General: alert and no distress Lochia: appropriate Abdomen: soft, NT Uterine Fundus: firm Incision: healing well, no significant drainage, no dehiscence, no significant erythema Extremities: No evidence of DVT seen on physical exam. No lower extremity edema. 1+ reflexes  Discharge Instructions: Per After Visit Summary. Activity: Advance as tolerated. Pelvic  rest for 6 weeks.  Also refer to After Visit Summary Diet: Regular Medications: Allergies as of 02/01/2019   No Known Allergies     Medication List    STOP taking these medications   ibuprofen 800 MG tablet Commonly known as: ADVIL     TAKE these medications   docusate sodium 100 MG capsule Commonly known as: Colace Take 1 capsule (100 mg total) by mouth daily as needed.   folic acid 664 MCG tablet Commonly known as: FOLVITE Take 400 mcg by mouth daily.   multivitamin-prenatal 27-0.8 MG Tabs tablet Take 1 tablet by mouth daily at 12 noon.   NIFEdipine 30 MG 24 hr tablet Commonly known as: PROCARDIA-XL/NIFEDICAL-XL Take 1 tablet (30 mg total) by mouth daily. Can increase to twice a day as needed for symptomatic contractions   oxyCODONE-acetaminophen 5-325 MG tablet Commonly known as: Percocet Take 1-2 tablets by mouth every 6 (six) hours as needed for severe pain.      Outpatient follow up: 02/03/2019  Benjaman Kindler, MD 02/01/2019

## 2019-02-01 NOTE — Progress Notes (Addendum)
Pt received discharge orders, pt was educated on importance of checking BP at home and having a follow up visit at [facility] on Friday. Pt will schedule that follow up. Pt verbalized understanding on importance to returning to ED if BP is elevated or headache comes back. Breastmilk given to pt from the fridge. Pt received prescription and will pickup from pharmacy. Pt d/c home via personal vehicle.

## 2019-02-01 NOTE — Progress Notes (Signed)
Assumption of care. Now on mag x15hrs, mild range BP overnight without antijhypertensive medication. Headache improved with caffeine this morning, not improved with narcotics overnight.   Vitals:   02/01/19 0801 02/01/19 0802  BP: (!) 150/118 (!) 153/81  Pulse: 90 74  Resp:  18  Temp:    SpO2: 99%    UOP 562ml.hr Labs were wnl  - Continue mag x24hrs - would like to leave within 5 hrs off mag due to transportation limits - Will continue to monitor sx

## 2019-02-06 ENCOUNTER — Telehealth: Payer: Self-pay | Admitting: Licensed Clinical Social Worker

## 2019-02-06 NOTE — Telephone Encounter (Signed)
LCSW received VM referral from Robin Martinique, [place], [place] on 02/03/2019. Patient referred due to history of mental health dx. and current depression symptoms. Patient also left vm requesting services.   LCSW called and spoke with patient 02/06/2019 conducted brief assessment and discussed most appropriate treatment options. LCSW discussed with patient that she should seek treatment at [facility] due to history of significant diagnosis. Patient currently voices depressed mood but denies any suicidal ideation, homicidal ideation, manic symptoms, hallucinations or psychotic thought patterns. She reports that the primary reason for seeking treatment is due to depression and a requirement from CPS. Patient reported she does have crisis number and LCSW also informed patient that if she does experience a crisis she can also utilize the [facility] walk-in crisis clinic or if needed call 911 or go to ED.   LCSW to follow up with Robin Martinique to notify her of plan.

## 2019-02-06 NOTE — Telephone Encounter (Signed)
LCSW followed up with Robin Martinique and updated her on plan for patient to follow up with [facility].

## 2019-02-06 NOTE — Telephone Encounter (Signed)
LCSW received vm from [place]

## 2021-01-11 IMAGING — CT CT ANGIOGRAPHY CHEST
2 of 6 series · 18 of 46 positions shown · IV contrast (APPLIED)
Comparison: None.

CLINICAL DATA: Chest pain.  One week postpartum

EXAM:
CT ANGIOGRAPHY CHEST WITH CONTRAST
TECHNIQUE: Multidetector CT imaging of the chest was performed using the
standard protocol during bolus administration of intravenous
contrast. Multiplanar CT image reconstructions and MIPs were
obtained to evaluate the vascular anatomy.
CONTRAST:  75mL OMNIPAQUE IOHEXOL 350 MG/ML SOLN

[Series 5: thins · axial · 0.63mm/px · z∈[-256,-32]mm · 16 of 246 slices shown]
[im 11/246  lung]
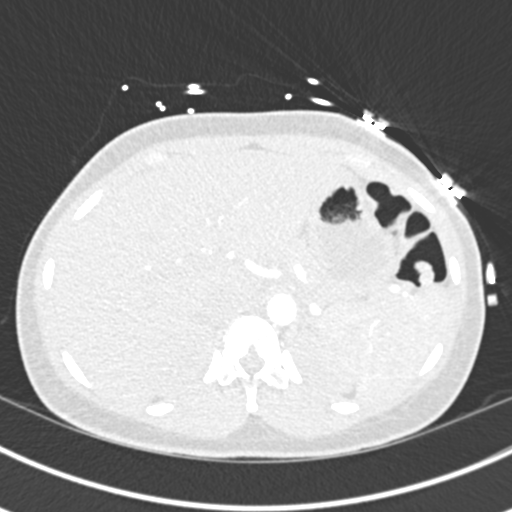
[im 32/246  soft-tissue]
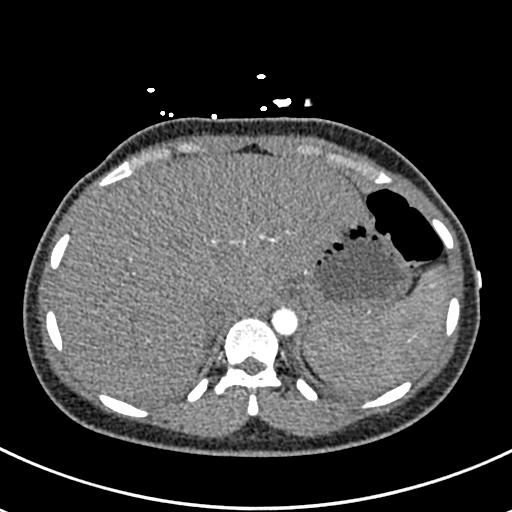
[im 43/246  lung]
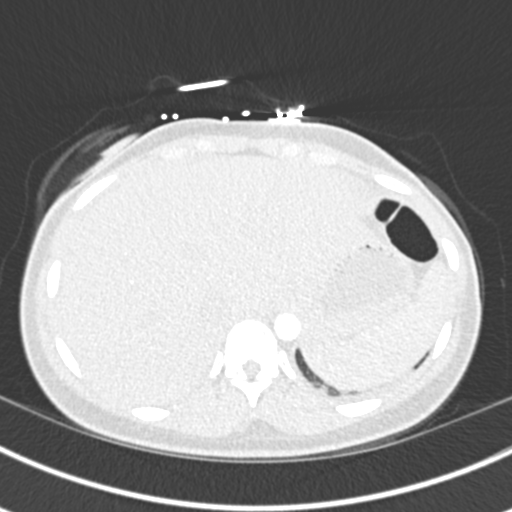
[im 54/246  soft-tissue]
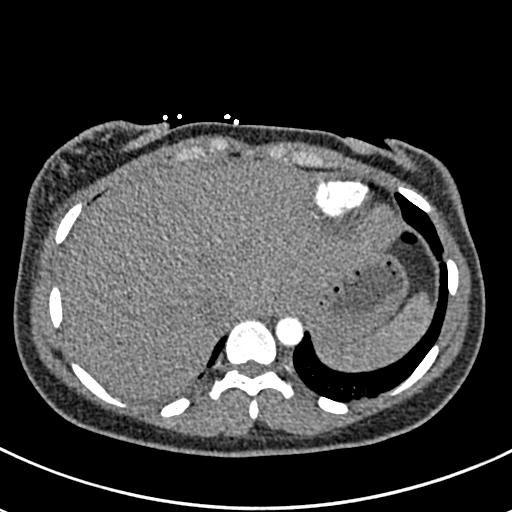
[im 75/246  lung]
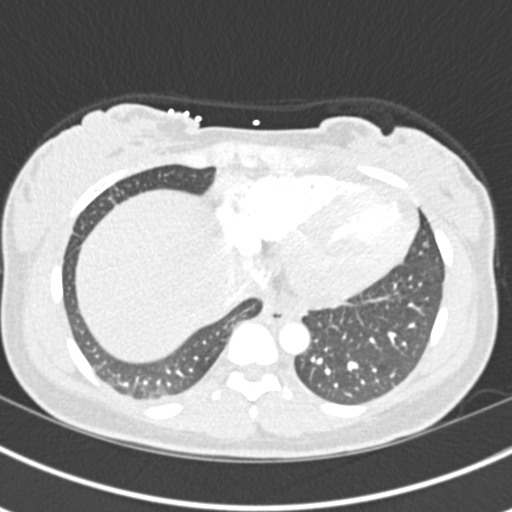
[im 86/246  soft-tissue]
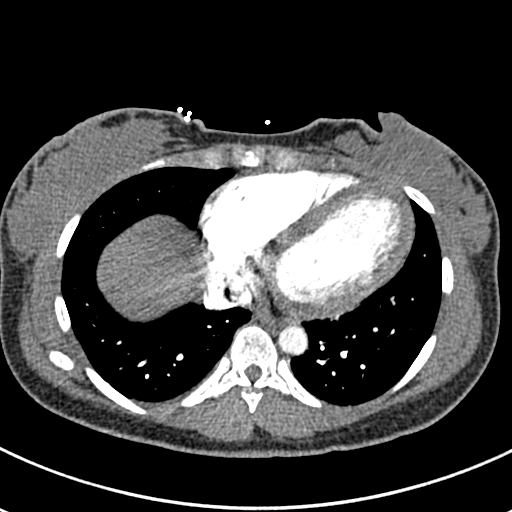
[im 96/246  lung]
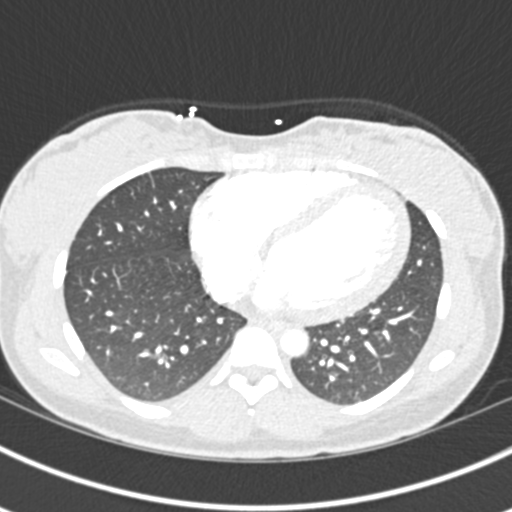
[im 118/246  soft-tissue]
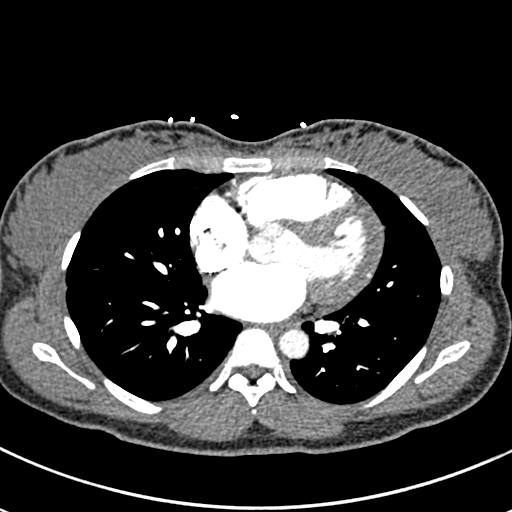
[im 128/246  lung]
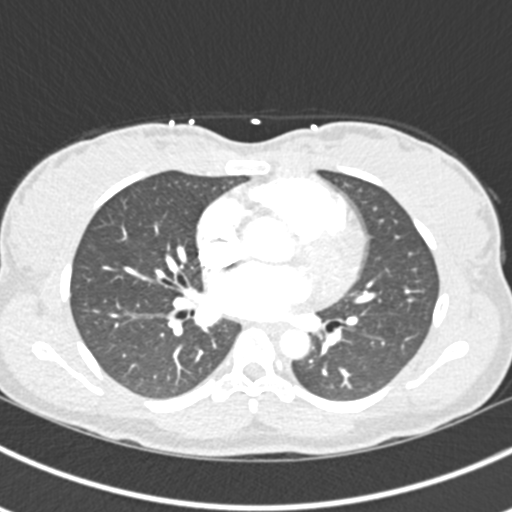
[im 150/246  soft-tissue]
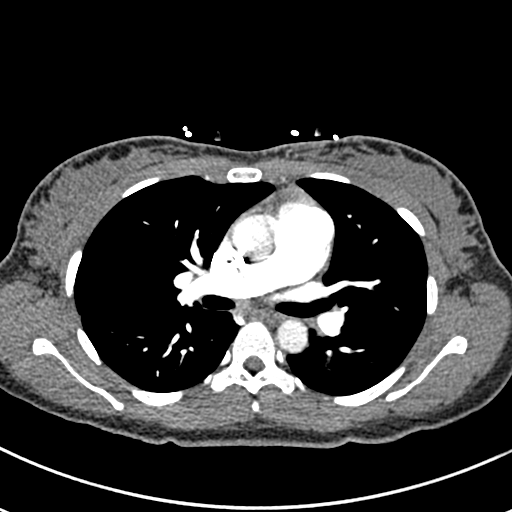
[im 160/246  lung]
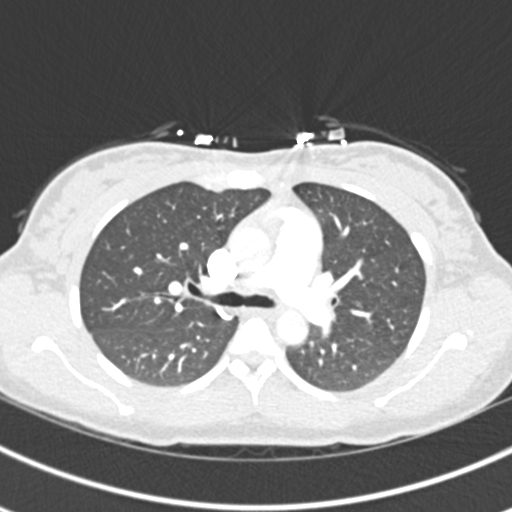
[im 171/246  soft-tissue]
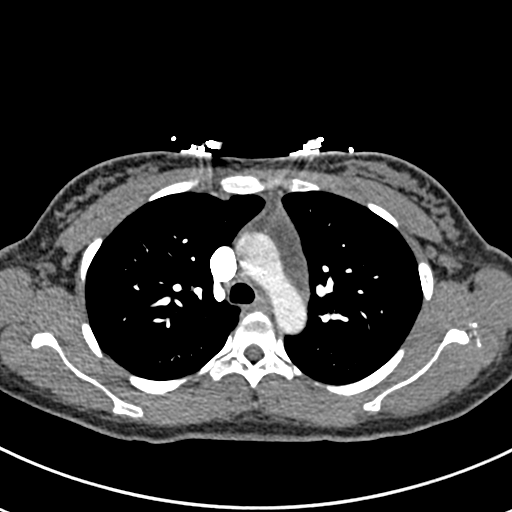
[im 192/246  lung]
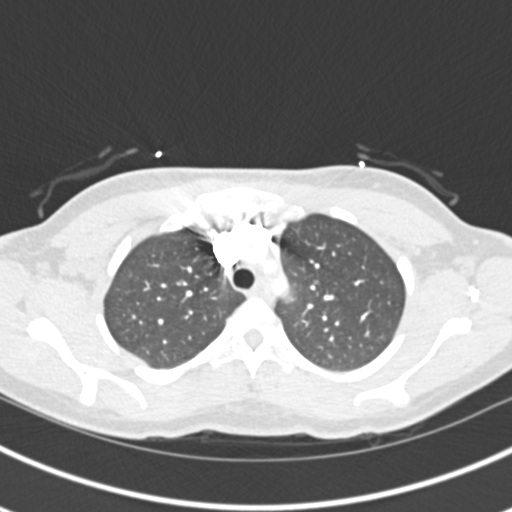
[im 203/246  soft-tissue]
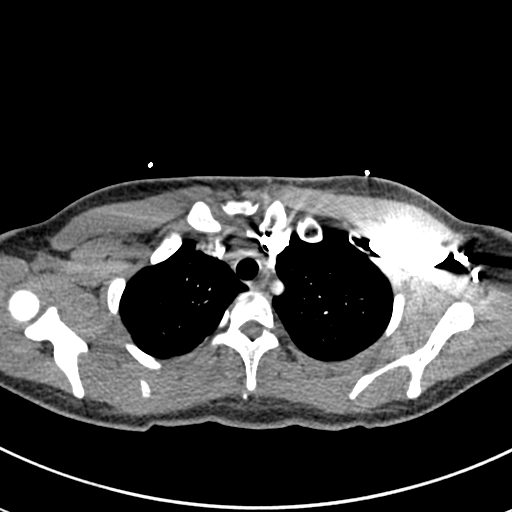
[im 214/246  lung]
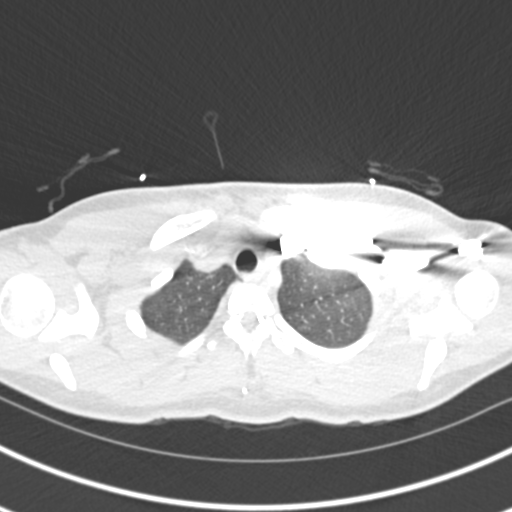
[im 235/246  soft-tissue]
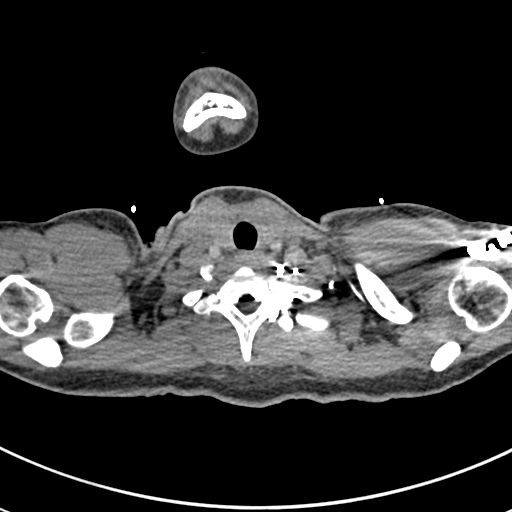

[Series 7: coronal mpr · coronal · 0.49mm/px · 2 of 72 slices shown]
[im 24/72  soft-tissue]
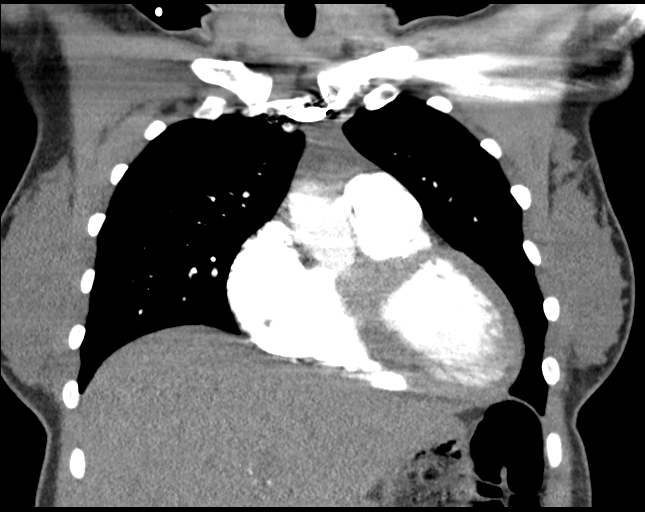
[im 48/72  soft-tissue]
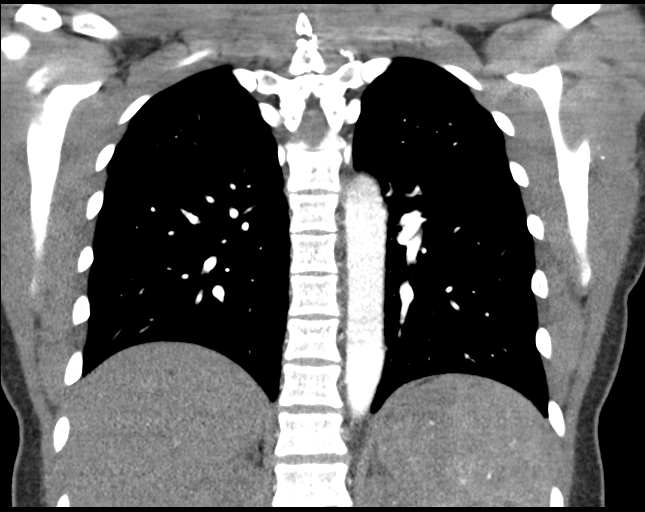

[18 of 46 positions shown; findings below may reference images not displayed]

FINDINGS: Cardiovascular: There is no demonstrable pulmonary embolus. There is
no thoracic aortic aneurysm or dissection. The visualized great
vessels appear unremarkable. There is no pericardial effusion or
pericardial thickening.

Mediastinum/Nodes: Thyroid appears normal. There is thymic tissue
present in the anterior mediastinum, within normal limits for age.
No evident thoracic adenopathy. No esophageal lesions are evident.

Lungs/Pleura: There is atelectatic change in the posterior lung
bases. No evident edema or consolidation. No pleural effusions are
evident.

Upper Abdomen: Visualized upper abdominal structures are normal in
appearance.

Musculoskeletal: There are no blastic or lytic bone lesions. No
evident chest wall lesions.

Review of the MIP images confirms the above findings.
IMPRESSION: 1. No demonstrable pulmonary embolus. No thoracic aortic aneurysm or
dissection.

2.  Slight bibasilar atelectasis.  No edema or consolidation.

3.  No evident adenopathy.
